# Patient Record
Sex: Male | Born: 1970 | Race: White | Hispanic: No | Smoking: Never smoker
Health system: Southern US, Community
[De-identification: ages and names within clinical notes are randomized; demographics above are authoritative.]

---

## 1999-06-08 ENCOUNTER — Emergency Department (HOSPITAL_COMMUNITY): Admission: EM | Admit: 1999-06-08 | Discharge: 1999-06-08 | Payer: Self-pay | Admitting: Emergency Medicine

## 1999-06-08 ENCOUNTER — Encounter: Payer: Self-pay | Admitting: Emergency Medicine

## 2002-06-08 ENCOUNTER — Emergency Department (HOSPITAL_COMMUNITY): Admission: AC | Admit: 2002-06-08 | Discharge: 2002-06-08 | Payer: Self-pay

## 2002-06-08 ENCOUNTER — Encounter: Payer: Self-pay | Admitting: *Deleted

## 2013-06-06 ENCOUNTER — Emergency Department (HOSPITAL_COMMUNITY)
Admission: EM | Admit: 2013-06-06 | Discharge: 2013-06-06 | Disposition: A | Discharge status: Home / Self Care | Payer: BC Managed Care – PPO | Attending: Emergency Medicine | Admitting: Emergency Medicine

## 2013-06-06 ENCOUNTER — Encounter (HOSPITAL_COMMUNITY): Payer: Self-pay | Admitting: Emergency Medicine

## 2013-06-06 ENCOUNTER — Emergency Department (HOSPITAL_COMMUNITY): Payer: BC Managed Care – PPO

## 2013-06-06 DIAGNOSIS — Y92410 Unspecified street and highway as the place of occurrence of the external cause: Secondary | ICD-10-CM | POA: Insufficient documentation

## 2013-06-06 DIAGNOSIS — S139XXA Sprain of joints and ligaments of unspecified parts of neck, initial encounter: Secondary | ICD-10-CM | POA: Insufficient documentation

## 2013-06-06 DIAGNOSIS — S161XXA Strain of muscle, fascia and tendon at neck level, initial encounter: Secondary | ICD-10-CM

## 2013-06-06 DIAGNOSIS — S0990XA Unspecified injury of head, initial encounter: Secondary | ICD-10-CM | POA: Insufficient documentation

## 2013-06-06 DIAGNOSIS — IMO0002 Reserved for concepts with insufficient information to code with codable children: Secondary | ICD-10-CM | POA: Insufficient documentation

## 2013-06-06 DIAGNOSIS — Y9389 Activity, other specified: Secondary | ICD-10-CM | POA: Insufficient documentation

## 2013-06-06 MED ORDER — OXYCODONE-ACETAMINOPHEN 5-325 MG PO TABS: 2.0000 | ORAL_TABLET | Freq: Four times a day (QID) | ORAL | Status: AC | PRN

## 2013-06-06 MED ORDER — METHOCARBAMOL 500 MG PO TABS: 500.0000 mg | ORAL_TABLET | Freq: Two times a day (BID) | ORAL | Status: AC

## 2013-06-06 NOTE — Discharge Instructions (Signed)
Cervical Strain and Sprain (Whiplash) °with Rehab °Cervical strain and sprains are injuries that commonly occur with "whiplash" injuries. Whiplash occurs when the neck is forcefully whipped backward or forward, such as during a motor vehicle accident. The muscles, ligaments, tendons, discs and nerves of the neck are susceptible to injury when this occurs. °SYMPTOMS  °· Pain or stiffness in the front and/or back of neck °· Symptoms may present immediately or up to 24 hours after injury. °· Dizziness, headache, nausea and vomiting. °· Muscle spasm with soreness and stiffness in the neck. °· Tenderness and swelling at the injury site. °CAUSES  °Whiplash injuries often occur during contact sports or motor vehicle accidents.  °RISK INCREASES WITH: °· Osteoarthritis of the spine. °· Situations that make head or neck accidents or trauma more likely. °· High-risk sports (football, rugby, wrestling, hockey, auto racing, gymnastics, diving, contact karate or boxing). °· Poor strength and flexibility of the neck. °· Previous neck injury. °· Poor tackling technique. °· Improperly fitted or padded equipment. °PREVENTION °· Learn and use proper technique (avoid tackling with the head, spearing and head-butting; use proper falling techniques to avoid landing on the head). °· Warm up and stretch properly before activity. °· Maintain physical fitness: °· Strength, flexibility and endurance. °· Cardiovascular fitness. °· Wear properly fitted and padded protective equipment, such as padded soft collars, for participation in contact sports. °PROGNOSIS  °Recovery for cervical strain and sprain injuries is dependent on the extent of the injury. These injuries are usually curable in 1 week to 3 months with appropriate treatment.  °RELATED COMPLICATIONS  °· Temporary numbness and weakness may occur if the nerve roots are damaged, and this may persist until the nerve has completely healed. °· Chronic pain due to frequent recurrence of  symptoms. °· Prolonged healing, especially if activity is resumed too soon (before complete recovery). °TREATMENT  °Treatment initially involves the use of ice and medication to help reduce pain and inflammation. It is also important to perform strengthening and stretching exercises and modify activities that worsen symptoms so the injury does not get worse. These exercises may be performed at home or with a therapist. For patients who experience severe symptoms, a soft padded collar may be recommended to be worn around the neck.  °Improving your posture may help reduce symptoms. Posture improvement includes pulling your chin and abdomen in while sitting or standing. If you are sitting, sit in a firm chair with your buttocks against the back of the chair. While sleeping, try replacing your pillow with a small towel rolled to 2 inches in diameter, or use a cervical pillow or soft cervical collar. Poor sleeping positions delay healing.  °For patients with nerve root damage, which causes numbness or weakness, the use of a cervical traction apparatus may be recommended. Surgery is rarely necessary for these injuries. However, cervical strain and sprains that are present at birth (congenital) may require surgery. °MEDICATION  °· If pain medication is necessary, nonsteroidal anti-inflammatory medications, such as aspirin and ibuprofen, or other minor pain relievers, such as acetaminophen, are often recommended. °· Do not take pain medication for 7 days before surgery. °· Prescription pain relievers may be given if deemed necessary by your caregiver. Use only as directed and only as much as you need. °HEAT AND COLD:  °· Cold treatment (icing) relieves pain and reduces inflammation. Cold treatment should be applied for 10 to 15 minutes every 2 to 3 hours for inflammation and pain and immediately after any activity that   aggravates your symptoms. Use ice packs or an ice massage. °· Heat treatment may be used prior to  performing the stretching and strengthening activities prescribed by your caregiver, physical therapist, or athletic trainer. Use a heat pack or a warm soak. °SEEK MEDICAL CARE IF:  °· Symptoms get worse or do not improve in 2 weeks despite treatment. °· New, unexplained symptoms develop (drugs used in treatment may produce side effects). °EXERCISES °RANGE OF MOTION (ROM) AND STRETCHING EXERCISES - Cervical Strain and Sprain °These exercises may help you when beginning to rehabilitate your injury. In order to successfully resolve your symptoms, you must improve your posture. These exercises are designed to help reduce the forward-head and rounded-shoulder posture which contributes to this condition. Your symptoms may resolve with or without further involvement from your physician, physical therapist or athletic trainer. While completing these exercises, remember:  °· Restoring tissue flexibility helps normal motion to return to the joints. This allows healthier, less painful movement and activity. °· An effective stretch should be held for at least 20 seconds, although you may need to begin with shorter hold times for comfort. °· A stretch should never be painful. You should only feel a gentle lengthening or release in the stretched tissue. °STRETCH- Axial Extensors °· Lie on your back on the floor. You may bend your knees for comfort. Place a rolled up hand towel or dish towel, about 2 inches in diameter, under the part of your head that makes contact with the floor. °· Gently tuck your chin, as if trying to make a "double chin," until you feel a gentle stretch at the base of your head. °· Hold __________ seconds. °Repeat __________ times. Complete this exercise __________ times per day.  °STRETECH - Axial Extension  °· Stand or sit on a firm surface. Assume a good posture: chest up, shoulders drawn back, abdominal muscles slightly tense, knees unlocked (if standing) and feet hip width apart. °· Slowly retract your  chin so your head slides back and your chin slightly lowers.Continue to look straight ahead. °· You should feel a gentle stretch in the back of your head. Be certain not to feel an aggressive stretch since this can cause headaches later. °· Hold for __________ seconds. °Repeat __________ times. Complete this exercise __________ times per day. °STRETCH  Cervical Side Bend  °· Stand or sit on a firm surface. Assume a good posture: chest up, shoulders drawn back, abdominal muscles slightly tense, knees unlocked (if standing) and feet hip width apart. °· Without letting your nose or shoulders move, slowly tip your right / left ear to your shoulder until your feel a gentle stretch in the muscles on the opposite side of your neck. °· Hold __________ seconds. °Repeat __________ times. Complete this exercise __________ times per day. °STRETCH  Cervical Rotators  °· Stand or sit on a firm surface. Assume a good posture: chest up, shoulders drawn back, abdominal muscles slightly tense, knees unlocked (if standing) and feet hip width apart. °· Keeping your eyes level with the ground, slowly turn your head until you feel a gentle stretch along the back and opposite side of your neck. °· Hold __________ seconds. °Repeat __________ times. Complete this exercise __________ times per day. °RANGE OF MOTION - Neck Circles  °· Stand or sit on a firm surface. Assume a good posture: chest up, shoulders drawn back, abdominal muscles slightly tense, knees unlocked (if standing) and feet hip width apart. °· Gently roll your head down and around from   the back of one shoulder to the back of the other. The motion should never be forced or painful. °· Repeat the motion 10-20 times, or until you feel the neck muscles relax and loosen. °Repeat __________ times. Complete the exercise __________ times per day. °STRENGTHENING EXERCISES - Cervical Strain and Sprain °These exercises may help you when beginning to rehabilitate your injury. They may  resolve your symptoms with or without further involvement from your physician, physical therapist or athletic trainer. While completing these exercises, remember:  °· Muscles can gain both the endurance and the strength needed for everyday activities through controlled exercises. °· Complete these exercises as instructed by your physician, physical therapist or athletic trainer. Progress the resistance and repetitions only as guided. °· You may experience muscle soreness or fatigue, but the pain or discomfort you are trying to eliminate should never worsen during these exercises. If this pain does worsen, stop and make certain you are following the directions exactly. If the pain is still present after adjustments, discontinue the exercise until you can discuss the trouble with your clinician. °STRENGTH Cervical Flexors, Isometric °· Face a wall, standing about 6 inches away. Place a small pillow, a ball about 6-8 inches in diameter, or a folded towel between your forehead and the wall. °· Slightly tuck your chin and gently push your forehead into the soft object. Push only with mild to moderate intensity, building up tension gradually. Keep your jaw and forehead relaxed. °· Hold 10 to 20 seconds. Keep your breathing relaxed. °· Release the tension slowly. Relax your neck muscles completely before you start the next repetition. °Repeat __________ times. Complete this exercise __________ times per day. °STRENGTH- Cervical Lateral Flexors, Isometric  °· Stand about 6 inches away from a wall. Place a small pillow, a ball about 6-8 inches in diameter, or a folded towel between the side of your head and the wall. °· Slightly tuck your chin and gently tilt your head into the soft object. Push only with mild to moderate intensity, building up tension gradually. Keep your jaw and forehead relaxed. °· Hold 10 to 20 seconds. Keep your breathing relaxed. °· Release the tension slowly. Relax your neck muscles completely before  you start the next repetition. °Repeat __________ times. Complete this exercise __________ times per day. °STRENGTH  Cervical Extensors, Isometric  °· Stand about 6 inches away from a wall. Place a small pillow, a ball about 6-8 inches in diameter, or a folded towel between the back of your head and the wall. °· Slightly tuck your chin and gently tilt your head back into the soft object. Push only with mild to moderate intensity, building up tension gradually. Keep your jaw and forehead relaxed. °· Hold 10 to 20 seconds. Keep your breathing relaxed. °· Release the tension slowly. Relax your neck muscles completely before you start the next repetition. °Repeat __________ times. Complete this exercise __________ times per day. °POSTURE AND BODY MECHANICS CONSIDERATIONS - Cervical Strain and Sprain °Keeping correct posture when sitting, standing or completing your activities will reduce the stress put on different body tissues, allowing injured tissues a chance to heal and limiting painful experiences. The following are general guidelines for improved posture. Your physician or physical therapist will provide you with any instructions specific to your needs. While reading these guidelines, remember: °· The exercises prescribed by your provider will help you have the flexibility and strength to maintain correct postures. °· The correct posture provides the optimal environment for your joints to   work. All of your joints have less wear and tear when properly supported by a spine with good posture. This means you will experience a healthier, less painful body. °· Correct posture must be practiced with all of your activities, especially prolonged sitting and standing. Correct posture is as important when doing repetitive low-stress activities (typing) as it is when doing a single heavy-load activity (lifting). °PROLONGED STANDING WHILE SLIGHTLY LEANING FORWARD °When completing a task that requires you to lean forward while  standing in one place for a long time, place either foot up on a stationary 2-4 inch high object to help maintain the best posture. When both feet are on the ground, the low back tends to lose its slight inward curve. If this curve flattens (or becomes too large), then the back and your other joints will experience too much stress, fatigue more quickly and can cause pain.  °RESTING POSITIONS °Consider which positions are most painful for you when choosing a resting position. If you have pain with flexion-based activities (sitting, bending, stooping, squatting), choose a position that allows you to rest in a less flexed posture. You would want to avoid curling into a fetal position on your side. If your pain worsens with extension-based activities (prolonged standing, working overhead), avoid resting in an extended position such as sleeping on your stomach. Most people will find more comfort when they rest with their spine in a more neutral position, neither too rounded nor too arched. Lying on a non-sagging bed on your side with a pillow between your knees, or on your back with a pillow under your knees will often provide some relief. Keep in mind, being in any one position for a prolonged period of time, no matter how correct your posture, can still lead to stiffness. °WALKING °Walk with an upright posture. Your ears, shoulders and hips should all line-up. °OFFICE WORK °When working at a desk, create an environment that supports good, upright posture. Without extra support, muscles fatigue and lead to excessive strain on joints and other tissues. °CHAIR: °· A chair should be able to slide under your desk when your back makes contact with the back of the chair. This allows you to work closely. °· The chair's height should allow your eyes to be level with the upper part of your monitor and your hands to be slightly lower than your elbows. °· Body position: °· Your feet should make contact with the floor. If this is  not possible, use a foot rest. °· Keep your ears over your shoulders. This will reduce stress on your neck and low back. °Document Released: 03/20/2005 Document Revised: 07/15/2012 Document Reviewed: 07/02/2008 °[employer]® Patient Information ©2014 [employer]. °Motor Vehicle Collision  °It is common to have multiple bruises and sore muscles after a motor vehicle collision (MVC). These tend to feel worse for the first 24 hours. You may have the most stiffness and soreness over the first several hours. You may also feel worse when you wake up the first morning after your collision. After this point, you will usually begin to improve with each day. The speed of improvement often depends on the severity of the collision, the number of injuries, and the location and nature of these injuries. °HOME CARE INSTRUCTIONS  °· Put ice on the injured area. °· Put ice in a plastic bag. °· Place a towel between your skin and the bag. °· Leave the ice on for 15-20 minutes, 03-04 times a day. °· Drink enough fluids to   keep your urine clear or pale yellow. Do not drink alcohol. °· Take a warm shower or bath once or twice a day. This will increase blood flow to sore muscles. °· You may return to activities as directed by your caregiver. Be careful when lifting, as this may aggravate neck or back pain. °· Only take over-the-counter or prescription medicines for pain, discomfort, or fever as directed by your caregiver. Do not use aspirin. This may increase bruising and bleeding. °SEEK IMMEDIATE MEDICAL CARE IF: °· You have numbness, tingling, or weakness in the arms or legs. °· You develop severe headaches not relieved with medicine. °· You have severe neck pain, especially tenderness in the middle of the back of your neck. °· You have changes in bowel or bladder control. °· There is increasing pain in any area of the body. °· You have shortness of breath, lightheadedness, dizziness, or fainting. °· You have chest pain. °· You feel  sick to your stomach (nauseous), throw up (vomit), or sweat. °· You have increasing abdominal discomfort. °· There is blood in your urine, stool, or vomit. °· You have pain in your shoulder (shoulder strap areas). °· You feel your symptoms are getting worse. °MAKE SURE YOU:  °· Understand these instructions. °· Will watch your condition. °· Will get help right away if you are not doing well or get worse. °Document Released: 03/20/2005 Document Revised: 06/12/2011 Document Reviewed: 08/17/2010 °[employer]® Patient Information ©2014 [employer]. ° °

## 2013-06-06 NOTE — ED Provider Notes (Signed)
Medical screening examination/treatment/procedure(s) were performed by non-physician practitioner and as supervising physician I was immediately available for consultation/collaboration.   EKG Interpretation None      Saanvi Hakala, MD, FACEP   Dartha Rozzell L Gabriela Irigoyen, MD 06/06/13 1619 
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
## 2013-06-06 NOTE — ED Notes (Signed)
Dahlia ClientBrowning, PA at bedside to speak with patient.
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
